# Patient Record
Sex: Female | Born: 1960 | Race: White | Hispanic: No | Marital: Married | Smoking: Never smoker
Health system: Southern US, Community
[De-identification: ages and names within clinical notes are randomized; demographics above are authoritative.]

## PROBLEM LIST (undated history)

## (undated) DIAGNOSIS — M797 Fibromyalgia: Secondary | ICD-10-CM

## (undated) DIAGNOSIS — M1990 Unspecified osteoarthritis, unspecified site: Secondary | ICD-10-CM

## (undated) DIAGNOSIS — F319 Bipolar disorder, unspecified: Secondary | ICD-10-CM

## (undated) DIAGNOSIS — F209 Schizophrenia, unspecified: Secondary | ICD-10-CM

## (undated) HISTORY — PX: KNEE SURGERY: SHX244

---

## 2014-03-01 ENCOUNTER — Encounter (HOSPITAL_BASED_OUTPATIENT_CLINIC_OR_DEPARTMENT_OTHER): Payer: Self-pay | Admitting: Emergency Medicine

## 2014-03-01 ENCOUNTER — Emergency Department (HOSPITAL_BASED_OUTPATIENT_CLINIC_OR_DEPARTMENT_OTHER): Payer: Federal, State, Local not specified - PPO

## 2014-03-01 ENCOUNTER — Emergency Department (HOSPITAL_BASED_OUTPATIENT_CLINIC_OR_DEPARTMENT_OTHER)
Admission: EM | Admit: 2014-03-01 | Discharge: 2014-03-02 | Disposition: A | Discharge status: Home / Self Care | Payer: Federal, State, Local not specified - PPO | Attending: Emergency Medicine | Admitting: Emergency Medicine

## 2014-03-01 DIAGNOSIS — F411 Generalized anxiety disorder: Secondary | ICD-10-CM | POA: Insufficient documentation

## 2014-03-01 DIAGNOSIS — R0600 Dyspnea, unspecified: Secondary | ICD-10-CM

## 2014-03-01 DIAGNOSIS — L259 Unspecified contact dermatitis, unspecified cause: Secondary | ICD-10-CM | POA: Insufficient documentation

## 2014-03-01 DIAGNOSIS — Z8659 Personal history of other mental and behavioral disorders: Secondary | ICD-10-CM | POA: Insufficient documentation

## 2014-03-01 DIAGNOSIS — L309 Dermatitis, unspecified: Secondary | ICD-10-CM

## 2014-03-01 DIAGNOSIS — F419 Anxiety disorder, unspecified: Secondary | ICD-10-CM

## 2014-03-01 DIAGNOSIS — Z8739 Personal history of other diseases of the musculoskeletal system and connective tissue: Secondary | ICD-10-CM | POA: Insufficient documentation

## 2014-03-01 HISTORY — DX: Bipolar disorder, unspecified: F31.9

## 2014-03-01 HISTORY — DX: Fibromyalgia: M79.7

## 2014-03-01 HISTORY — DX: Schizophrenia, unspecified: F20.9

## 2014-03-01 HISTORY — DX: Unspecified osteoarthritis, unspecified site: M19.90

## 2014-03-01 LAB — COMPREHENSIVE METABOLIC PANEL
ALT: 16 U/L (ref 0–35)
AST: 10 U/L (ref 0–37)
Albumin: 3.7 g/dL (ref 3.5–5.2)
Alkaline Phosphatase: 67 U/L (ref 39–117)
BUN: 12 mg/dL (ref 6–23)
CALCIUM: 11 mg/dL — AB (ref 8.4–10.5)
CO2: 24 meq/L (ref 19–32)
Chloride: 96 mEq/L (ref 96–112)
Creatinine, Ser: 0.8 mg/dL (ref 0.50–1.10)
GFR, EST NON AFRICAN AMERICAN: 83 mL/min — AB (ref >90)
GLUCOSE: 122 mg/dL — AB (ref 70–99)
Potassium: 3.6 mEq/L — ABNORMAL LOW (ref 3.7–5.3)
Sodium: 133 mEq/L — ABNORMAL LOW (ref 137–147)
Total Bilirubin: 0.2 mg/dL — ABNORMAL LOW (ref 0.3–1.2)
Total Protein: 7.2 g/dL (ref 6.0–8.3)

## 2014-03-01 LAB — CBC WITH DIFFERENTIAL/PLATELET
BASOS PCT: 0 % (ref 0–1)
Basophils Absolute: 0 10*3/uL (ref 0.0–0.1)
EOS ABS: 0.2 10*3/uL (ref 0.0–0.7)
EOS PCT: 1 % (ref 0–5)
HEMATOCRIT: 31 % — AB (ref 36.0–46.0)
HEMOGLOBIN: 9.9 g/dL — AB (ref 12.0–15.0)
Lymphocytes Relative: 10 % — ABNORMAL LOW (ref 12–46)
Lymphs Abs: 1.2 10*3/uL (ref 0.7–4.0)
MCH: 31.7 pg (ref 26.0–34.0)
MCHC: 31.9 g/dL (ref 30.0–36.0)
MCV: 99.4 fL (ref 78.0–100.0)
MONO ABS: 1.1 10*3/uL — AB (ref 0.1–1.0)
Monocytes Relative: 9 % (ref 3–12)
Neutro Abs: 9.2 10*3/uL — ABNORMAL HIGH (ref 1.7–7.7)
Neutrophils Relative %: 79 % — ABNORMAL HIGH (ref 43–77)
Platelets: 232 10*3/uL (ref 150–400)
RBC: 3.12 MIL/uL — ABNORMAL LOW (ref 3.87–5.11)
RDW: 14 % (ref 11.5–15.5)
WBC: 11.5 10*3/uL — ABNORMAL HIGH (ref 4.0–10.5)

## 2014-03-01 LAB — TROPONIN I: Troponin I: <0.3 ng/mL (ref <0.30)

## 2014-03-01 LAB — PRO B NATRIURETIC PEPTIDE: Pro B Natriuretic peptide (BNP): 78.9 pg/mL (ref 0–125)

## 2014-03-01 NOTE — ED Provider Notes (Signed)
CSN: [id number]     Arrival date & time 03/01/14  2217 History  This chart was scribed for Kathy Kaplan, MD by Blanchard Kelch, ED Scribe. The patient was seen in room MH11/MH11. Patient's care was started at 11:05 PM.     Chief Complaint  Patient presents with  . Shortness of Breath    Patient is a 53 y.o. female presenting with shortness of breath. The history is provided by the patient. No language interpreter was used.  Shortness of Breath   HPI Comments: Kathy Cooley is a 53 y.o. female with a history of fibromyalgia, bipolar disorder, schizophrenia, hypertension, thyroid disease brought in by ambulance who presents to the Emergency Department complaining of an episode of shortness of breath that occurred while she was driving to work two and a half hours ago. The episode lasted about an hour. She states that she had associated shaking, generalized weakness and a "cloudy head" with the shortness of breath. She is unsure what alleviated the symptoms but states that her head feels clear again. She states that she is "in remission" for her mental issues. She denies hallucinations, SI or HI currently. She also reports a burning, erythematous, draining rash to her bilateral lower legs. She states that it has been present for months but she has been seen by a doctor for it. She has been putting a prescribed lotion on the rash without relief. She does not believe she is on antibiotics for it but does not have her medications with her. She takes Lithium and Geodine for her schizophrenia. She reports taking all her mediations compliantly. She denies a history of MI or diabetes.    [phone] is the number of her husband, who may be on the way to the ED.     Past Medical History  Diagnosis Date  . Fibromyalgia   . Arthritis   . Bipolar 1 disorder   . Schizophrenia    Past Surgical History  Procedure Laterality Date  . Knee surgery     History reviewed. No pertinent family  history. History  Substance Use Topics  . Smoking status: Never Smoker   . Smokeless tobacco: Not on file  . Alcohol Use: No   OB History   Grav Para Term Preterm Abortions TAB SAB Ect Mult Living                 Review of Systems  Respiratory: Positive for shortness of breath.   Neurological: Positive for tremors and weakness.  Psychiatric/Behavioral: Negative for suicidal ideas, hallucinations, behavioral problems, confusion, self-injury and decreased concentration. The patient is not nervous/anxious.       Allergies  Review of patient's allergies indicates no known allergies.  Home Medications  No current outpatient prescriptions on file. Triage Vitals: BP 157/53  Pulse 71  Temp(Src) 97.8 F (36.6 C) (Oral)  Resp 22  Ht 5\' 3"  (1.6 m)  Wt 277 lb (125.646 kg)  BMI 49.08 kg/m2  SpO2 97%  Physical Exam  Nursing note and vitals reviewed. Constitutional: She is oriented to person, place, and time. She appears well-developed and well-nourished. No distress.  HENT:  Head: Normocephalic and atraumatic.  Eyes: EOM are normal.  Neck: Normal range of motion.  Mild distention of neck veins.   Cardiovascular: Normal rate and regular rhythm.   No murmur heard. Pulmonary/Chest: Effort normal and breath sounds normal. She has no wheezes. She has no rhonchi. She has no rales.  Neurological: She is alert and oriented to person, place,  and time.  Skin: Skin is warm and dry.  Bilateral leg edema with some hyperpigmentation of the skin. Both of the legs are warm to touch. No crepitus. Both of the legs have 6x6 cm of granular patches of tissue. No active drainage. Mild tenderness with palpation.  Psychiatric: She has a normal mood and affect. Judgment and thought content normal.    ED Course  Procedures (including critical care time)  DIAGNOSTIC STUDIES: Oxygen Saturation is 97% on room air, adequate by my interpretation.    COORDINATION OF CARE: 11:34 PM -Will order chest  x-ray, Lithium level, CBC, BNP, Troponin I, CMP and EKG. Patient verbalizes understanding and agrees with treatment plan.    Labs Review Labs Reviewed  CBC WITH DIFFERENTIAL - Abnormal; Notable for the following:    WBC 11.5 (*)    RBC 3.12 (*)    Hemoglobin 9.9 (*)    HCT 31.0 (*)    Neutrophils Relative % 79 (*)    Neutro Abs 9.2 (*)    Lymphocytes Relative 10 (*)    Monocytes Absolute 1.1 (*)    All other components within normal limits  COMPREHENSIVE METABOLIC PANEL - Abnormal; Notable for the following:    Sodium 133 (*)    Potassium 3.6 (*)    Glucose, Bld 122 (*)    Calcium 11.0 (*)    Total Bilirubin 0.2 (*)    GFR calc non Af Amer 83 (*)    All other components within normal limits  PRO B NATRIURETIC PEPTIDE  TROPONIN I  LITHIUM LEVEL   Imaging Review Dg Chest 2 View  03/01/2014   CLINICAL DATA:  Shortness of breath.  EXAM: CHEST  2 VIEW  COMPARISON:  None.  FINDINGS: Mild cardiomegaly. No edema or consolidation. Increased markings at the bases. No effusion or pneumothorax.  IMPRESSION: Mild atelectasis or bronchitic change at the bases.   Electronically Signed   By: Tiburcio PeaJonathan  Watts M.D.   On: 03/01/2014 23:35     EKG Interpretation None      MDM   Final diagnoses:  None   I personally performed the services described in this documentation, which was scribed in my presence. The recorded information has been reviewed and is accurate.  Pt comes in with cc of dib. She has no significant medical hx, but c/o psych related issues. Sudden onset dib, with "cloudy feeling" of her head. [place] in [place], and was drinving when the sx started. She started shaking as well, when it appears that her co-employee called EMS. She had no chest pain, and is symptom free right now, resting comfortably.  Unsure what caused her sudden onset dyspnea, shaking and "head clouding".   Her leg exam shows skin hyperpigmentation and some raised patches, yellow in color  diffusely. States that she is being treated by PCP for dermatitis / cellulitis - and that her legs are unchanged. Labs are normal, mild leukocytosis, lithium is normal.   On the psych front, she has a little abnormal affect and may be a little forced speech - but she is certainly not demonstrating grandiose, poor judgement, and doesn't appear to be unstable manic. She has no SI/HI/Psychoses/delusions - and feels normal in that regard. She rahter see her psychiatrist.  Will await husband to pick her up.   7:24 AM Husband on his way. Pt's psych asessment is still the same. Litle forced speech - otherwise no instability. [facility] Fre[facility]Allen [facility] to [facility] up.  Kathy Kaplan, MD 03/02/14 [phone]

## 2014-03-01 NOTE — ED Notes (Signed)
Pt was at work, states that SOB started while driving to work.  Got worse once she got to work.  BS clear, no respiratory distress.

## 2014-03-01 NOTE — ED Notes (Signed)
Pt placed on heart monitor.

## 2014-03-02 LAB — LITHIUM LEVEL: Lithium Lvl: 1.18 mEq/L (ref 0.80–1.40)

## 2014-03-02 MED ORDER — ONDANSETRON HCL 4 MG/2ML IJ SOLN
4.0000 mg | Freq: Once | INTRAMUSCULAR | Status: AC
  Administered 2014-03-02: 4 mg via INTRAVENOUS

## 2014-03-02 MED ORDER — ONDANSETRON HCL 4 MG/2ML IJ SOLN
INTRAMUSCULAR | Status: AC
  Filled 2014-03-02: qty 2

## 2014-03-02 NOTE — Discharge Instructions (Signed)
We saw you in the ER for the difficulty in breathing, and your head complains. All the results in the ER are normal, labs and imaging. We are not sure what is causing your symptoms. The workup in the ER is not complete, and is limited to screening for life threatening and emergent conditions only, so please see a primary care doctor for further evaluation. Also - it is important to make sure you see your primary doctor for proper management of your legs.  Generalized Anxiety Disorder Generalized anxiety disorder (GAD) is a mental disorder. It interferes with life functions, including relationships, work, and school. GAD is different from normal anxiety, which everyone experiences at some point in their lives in response to specific life events and activities. Normal anxiety actually helps us prepare for and get through these life events and activities. Normal anxiety goes away after the event or activity is over.  GAD causes anxiety that is not necessarily related to specific events or activities. It also causes excess anxiety in proportion to specific events or activities. The anxiety associated with GAD is also difficult to control. GAD can vary from mild to severe. People with severe GAD can have intense waves of anxiety with physical symptoms (panic attacks).  SYMPTOMS The anxiety and worry associated with GAD are difficult to control. This anxiety and worry are related to many life events and activities and also occur more days than not for 6 months or longer. People with GAD also have three or more of the following symptoms (one or more in children):  Restlessness.   Fatigue.  Difficulty concentrating.   Irritability.  Muscle tension.  Difficulty sleeping or unsatisfying sleep. DIAGNOSIS GAD is diagnosed through an assessment by your caregiver. Your caregiver will ask you questions aboutyour mood,physical symptoms, and events in your life. Your caregiver may ask you about your  medical history and use of alcohol or drugs, including prescription medications. Your caregiver may also do a physical exam and blood tests. Certain medical conditions and the use of certain substances can cause symptoms similar to those associated with GAD. Your caregiver may refer you to a mental health specialist for further evaluation. TREATMENT The following therapies are usually used to treat GAD:   Medication Antidepressant medication usually is prescribed for long-term daily control. Antianxiety medications may be added in severe cases, especially when panic attacks occur.   Talk therapy (psychotherapy) Certain types of talk therapy can be helpful in treating GAD by providing support, education, and guidance. A form of talk therapy called cognitive behavioral therapy can teach you healthy ways to think about and react to daily life events and activities.  Stress managementtechniques These include yoga, meditation, and exercise and can be very helpful when they are practiced regularly. A mental health specialist can help determine which treatment is best for you. Some people see improvement with one therapy. However, other people require a combination of therapies. Document Released: 03/19/2013 Document Reviewed: 03/19/2013 [facility] Patient Information 2014 [place], [place].

## 2014-03-02 NOTE — ED Notes (Signed)
Pt reports that her husband gets up at 5am, states that at that time, he should be travelling down to pick her up.  States that husband had to sleep.  [place] and will take approx 2 hours to get here.

## 2014-03-02 NOTE — ED Notes (Signed)
Husband is here to pick up pt.  He states she is acting appropriately for her but has been declining in health over the past several weeks.  Encouraged pt and husband to follow up with her physician and psychiatrist.

## 2014-10-17 IMAGING — CR DG CHEST 2V
2 series · 2 of 2 positions shown · non-contrast
Comparison: None.

CLINICAL DATA: Shortness of breath.

EXAM:
CHEST  2 VIEW

[w chest pa]
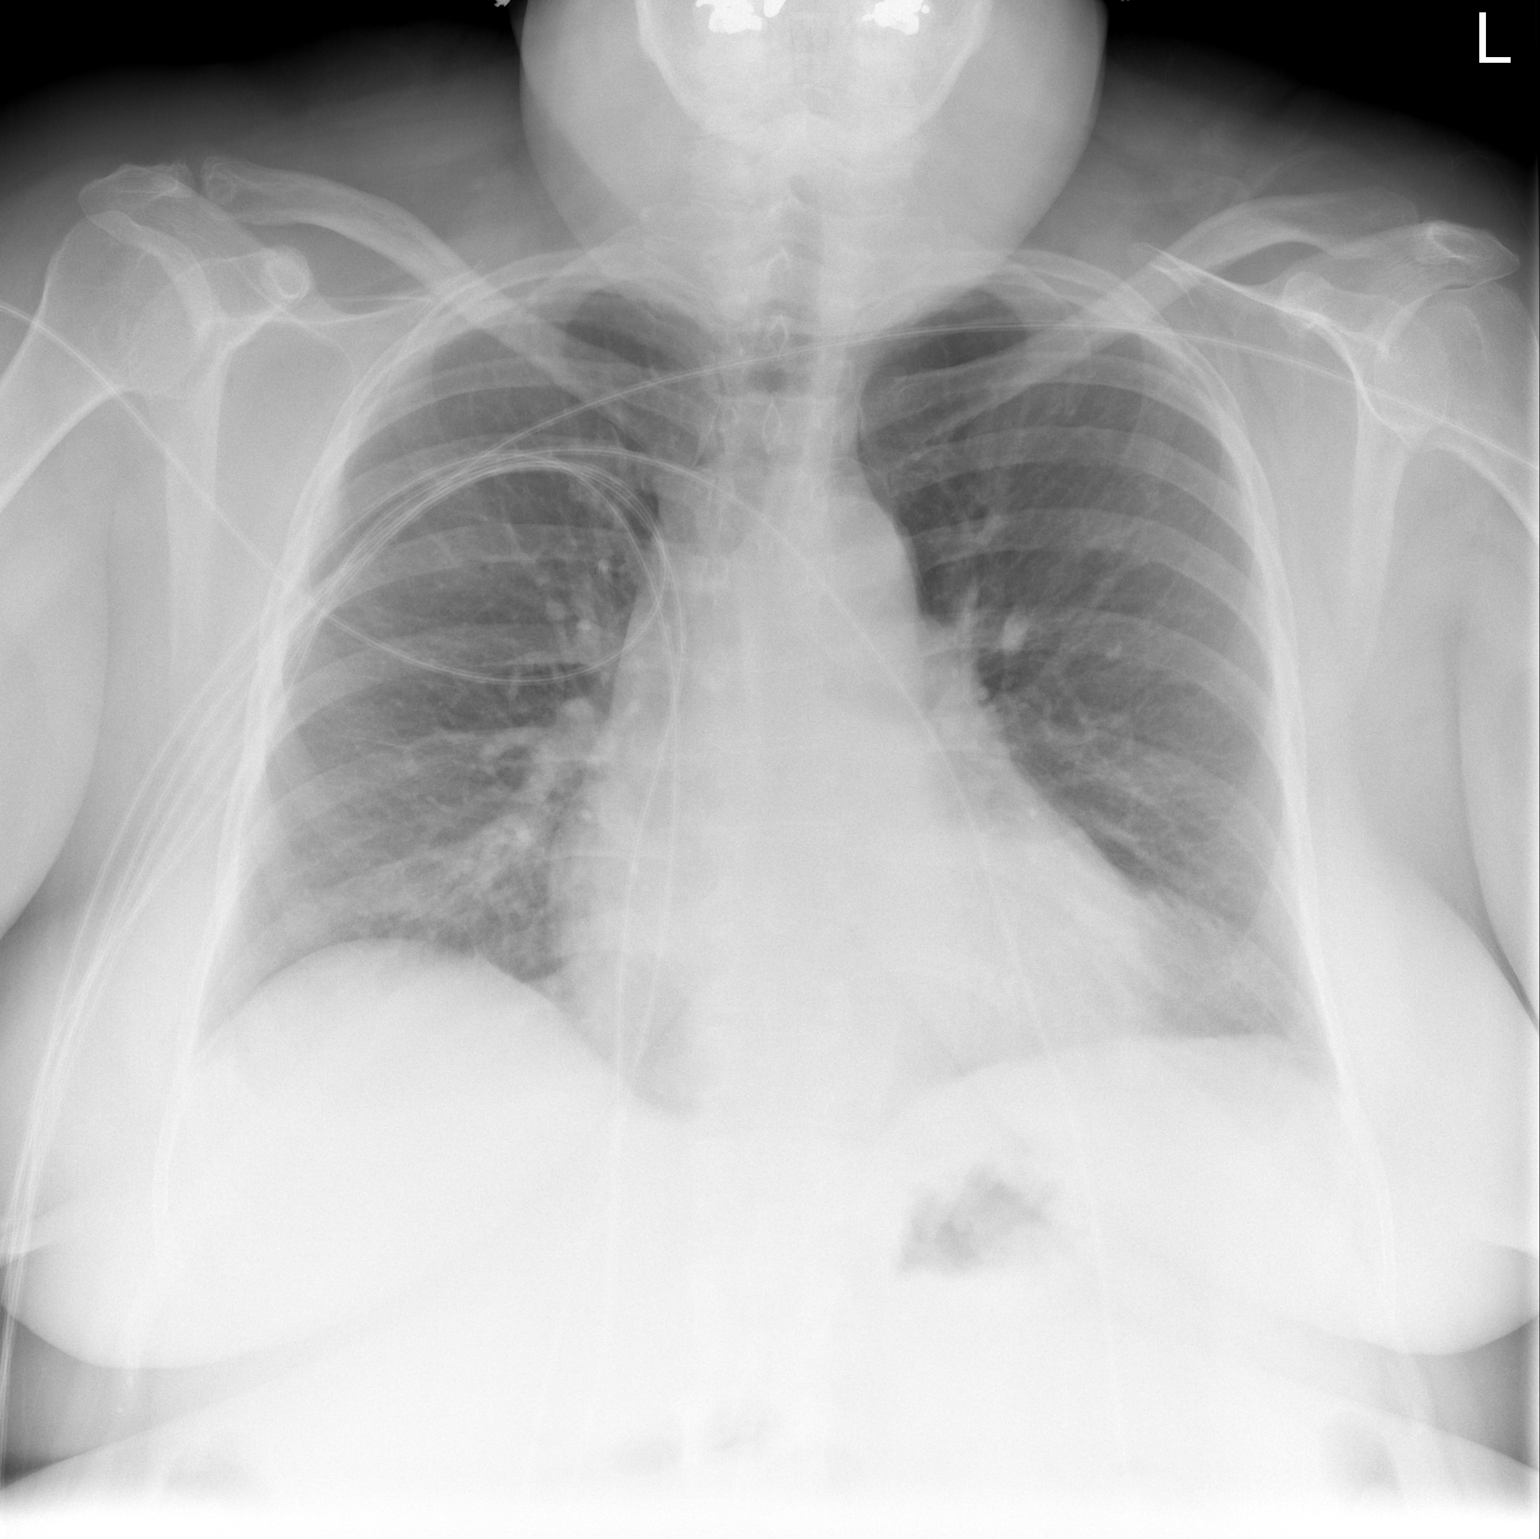

[w chest lat]
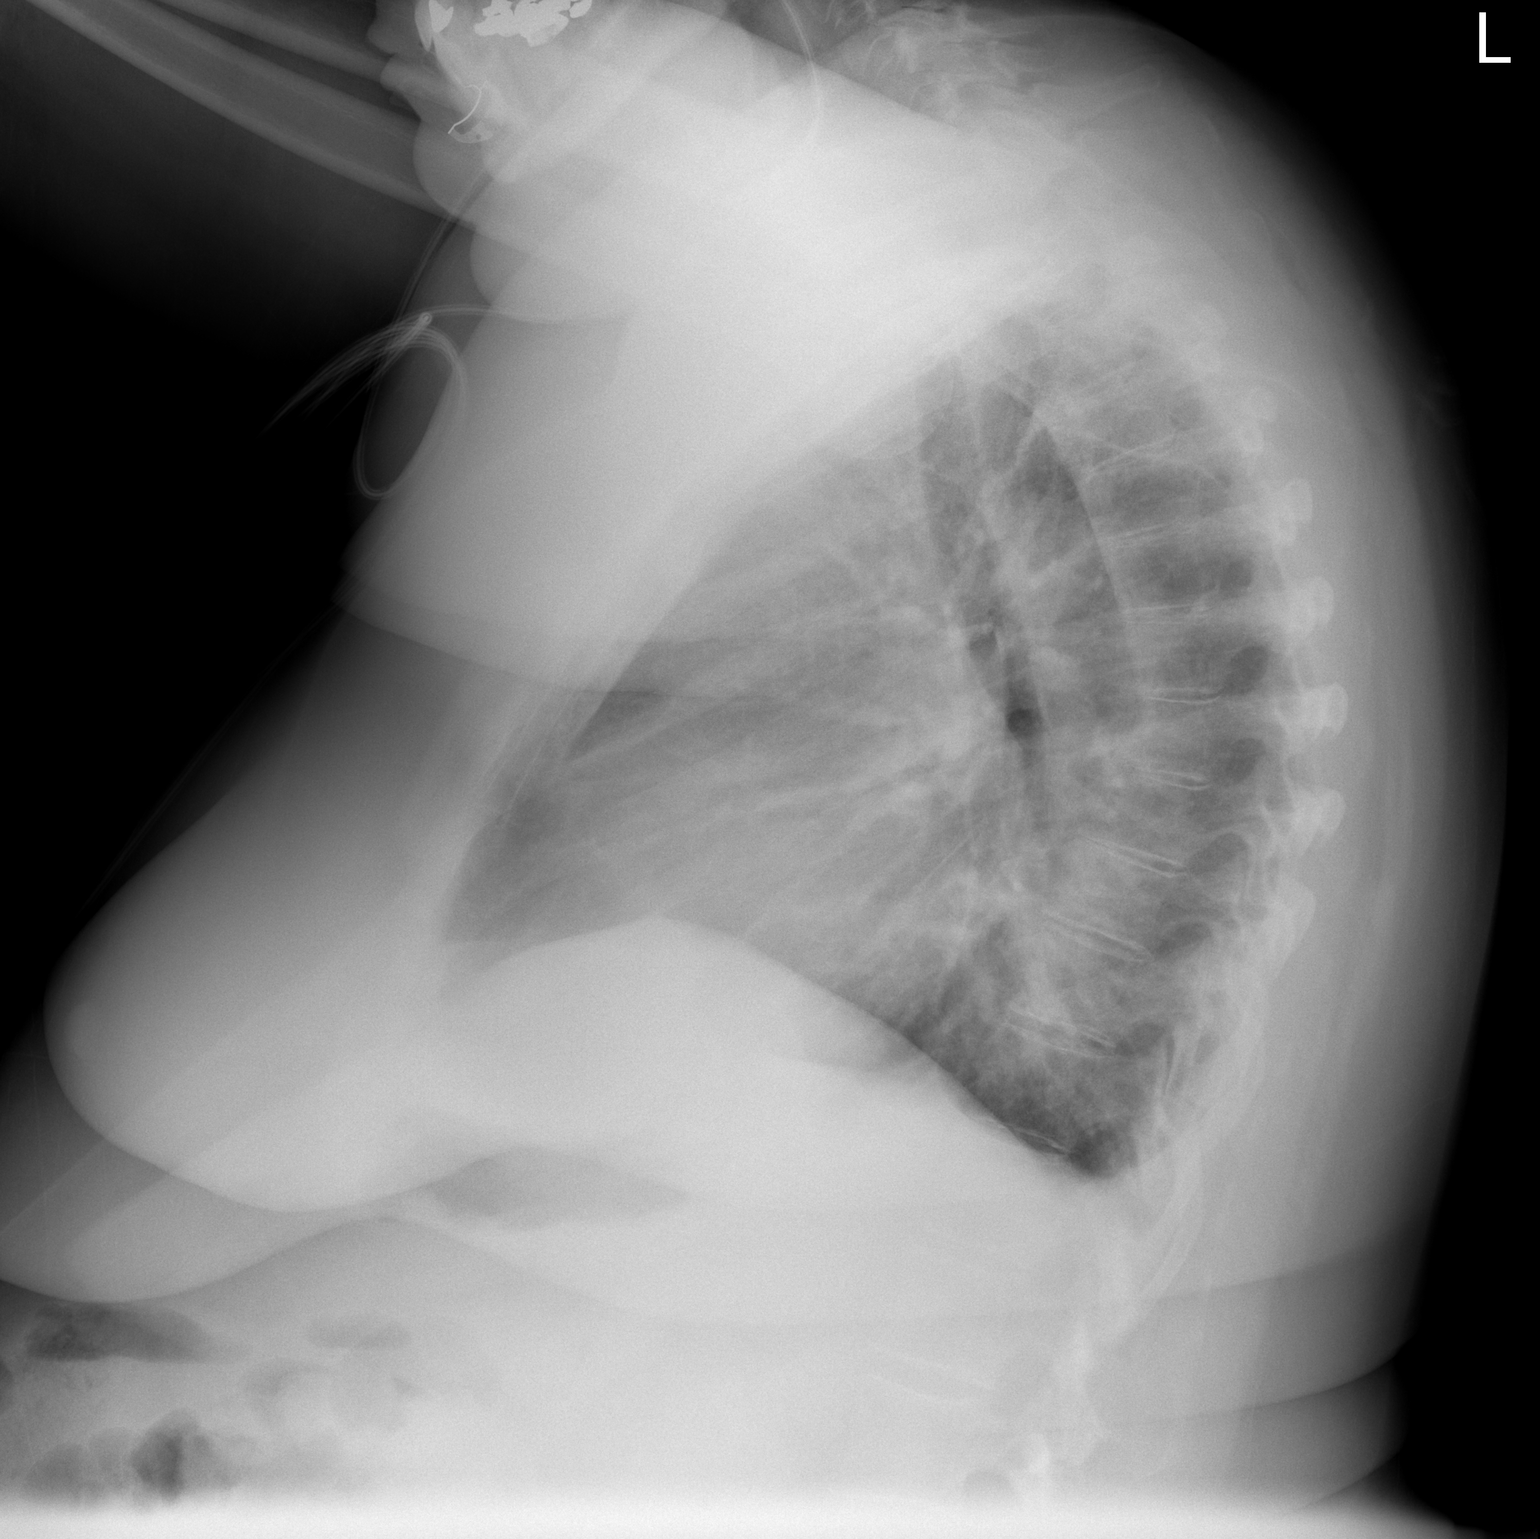

[2 of 2 positions shown; findings below may reference images not displayed]

FINDINGS: Mild cardiomegaly. No edema or consolidation. Increased markings at
the bases. No effusion or pneumothorax.
IMPRESSION: Mild atelectasis or bronchitic change at the bases.

## 2016-04-30 ENCOUNTER — Emergency Department (HOSPITAL_COMMUNITY)
Admission: EM | Admit: 2016-04-30 | Discharge: 2016-04-30 | Disposition: A | Discharge status: Home / Self Care | Attending: Emergency Medicine | Admitting: Emergency Medicine

## 2016-04-30 ENCOUNTER — Encounter (HOSPITAL_COMMUNITY): Payer: Self-pay | Admitting: *Deleted

## 2016-04-30 DIAGNOSIS — Y939 Activity, unspecified: Secondary | ICD-10-CM | POA: Insufficient documentation

## 2016-04-30 DIAGNOSIS — F319 Bipolar disorder, unspecified: Secondary | ICD-10-CM | POA: Insufficient documentation

## 2016-04-30 DIAGNOSIS — W228XXA Striking against or struck by other objects, initial encounter: Secondary | ICD-10-CM | POA: Diagnosis not present

## 2016-04-30 DIAGNOSIS — Y999 Unspecified external cause status: Secondary | ICD-10-CM | POA: Diagnosis not present

## 2016-04-30 DIAGNOSIS — Y929 Unspecified place or not applicable: Secondary | ICD-10-CM | POA: Diagnosis not present

## 2016-04-30 DIAGNOSIS — Z23 Encounter for immunization: Secondary | ICD-10-CM | POA: Diagnosis not present

## 2016-04-30 DIAGNOSIS — S81811A Laceration without foreign body, right lower leg, initial encounter: Secondary | ICD-10-CM

## 2016-04-30 MED ORDER — LIDOCAINE HCL 2 % IJ SOLN
INTRAMUSCULAR | Status: AC
  Filled 2016-04-30: qty 20

## 2016-04-30 MED ORDER — LIDOCAINE-EPINEPHRINE (PF) 2 %-1:200000 IJ SOLN: 10.0000 mL | Freq: Once | INTRAMUSCULAR | Status: DC

## 2016-04-30 MED ORDER — TETANUS-DIPHTH-ACELL PERTUSSIS 5-2.5-18.5 LF-MCG/0.5 IM SUSP
0.5000 mL | Freq: Once | INTRAMUSCULAR | Status: AC
  Administered 2016-04-30: 0.5 mL via INTRAMUSCULAR
  Filled 2016-04-30: qty 0.5

## 2016-04-30 NOTE — ED Provider Notes (Signed)
CSN: [id number]     Arrival date & time 04/30/16  0444 History   First MD Initiated Contact with Patient 04/30/16 [phone]     Chief Complaint  Patient presents with  . Extremity Laceration   (Consider location/radiation/quality/duration/timing/severity/associated sxs/prior Treatment) HPI 55 y.o. female presents to the Emergency Department today complaining of right posterior lower extremity laceration since 3AM. Notes backing into metal cart and suffering 4 inch laceration. Bleeding controlled. Pain is 7/10. Has not tried any OTC remedies. No fevers. Unsure of last Tetanus. No other symptoms noted.   Past Medical History  Diagnosis Date  . Fibromyalgia   . Arthritis   . Bipolar 1 disorder (HCC)   . Schizophrenia [facility](HCC)    Past Surgical History  Procedure Laterality Date  . Knee surgery     No family history on file. Social History  Substance Use Topics  . Smoking status: Never Smoker   . Smokeless tobacco: None  . Alcohol Use: No   OB History    No data available     Review of Systems  Constitutional: Negative for fever.  Musculoskeletal: Negative for myalgias and gait problem.  Skin: Positive for wound.   Allergies  Review of patient's allergies indicates no known allergies.  Home Medications   Prior to Admission medications   Not on File   BP 164/68 mmHg  Pulse 82  Temp(Src) 98.1 F (36.7 C) (Oral)  Resp 20  SpO2 94%   Physical Exam  Constitutional: She is oriented to person, place, and time. She appears well-developed and well-nourished.  HENT:  Head: Normocephalic and atraumatic.  Eyes: EOM are normal.  Cardiovascular: Normal rate and regular rhythm.   Pulmonary/Chest: Effort normal.  Abdominal: Soft.  Musculoskeletal: Normal range of motion.  4 inch right posterior lower extremity laceration ~4 inches. Jagged. Bleeding controlled at this time. Neurovascularly intact.   Neurological: She is alert and oriented to person, place, and time.  Skin: Skin is  warm and dry.  Psychiatric: She has a normal mood and affect. Her behavior is normal. Thought content normal.  Nursing note and vitals reviewed.   ED Course  .Marland Kitchen.Laceration Repair Date/Time: 04/30/2016 8:02 AM Performed by: Audry PiliMOHR, Elaysha Bevard Authorized by: Audry PiliMOHR, Tyion Boylen Consent: Verbal consent obtained. Risks and benefits: risks, benefits and alternatives were discussed Consent given by: patient Patient understanding: patient states understanding of the procedure being performed Patient consent: the patient's understanding of the procedure matches consent given Patient identity confirmed: verbally with patient and arm band Body area: lower extremity Location details: right lower leg Laceration length: 8 cm Foreign bodies: no foreign bodies Tendon involvement: none Nerve involvement: none Vascular damage: no Anesthesia: local infiltration Local anesthetic: lidocaine 1% with epinephrine Anesthetic total: 5 ml Irrigation solution: saline Irrigation method: syringe Amount of cleaning: standard Debridement: minimal Degree of undermining: minimal Skin closure: 3-0 Prolene Number of sutures: 10 Technique: running Approximation: close Approximation difficulty: simple Dressing: 4x4 sterile gauze, antibiotic ointment and gauze roll Patient tolerance: Patient tolerated the procedure well with no immediate complications   (including critical care time) Labs Review Labs Reviewed - No data to display  Imaging Review No results found. I have personally reviewed and evaluated these images and lab results as part of my medical decision-making.   EKG Interpretation None     MDM  I have reviewed the relevant previous healthcare records. I obtained HPI from historian. Patient discussed with supervising physician  ED Course:  Assessment: Patient is a 54yF that presents with laceration to  posterior left lower extremity. Tdap booster given. Pressure irrigation performed. Bottom of the wound  visualized with bleeding controled. Laceration occurred < 8 hours prior to repair which was well tolerated. Pt has no co morbidities to effect normal wound healing. Discussed suture home care w pt and answered questions. Pt to f-u for wound check and suture removal in 10 days. Pt is hemodynamically stable w no complaints prior to dc.    Disposition/Plan:  DC Home Additional Verbal discharge instructions given and discussed with patient.  Pt Instructed to f/u with PCP/ED in the next 8-10 days for evaluation and treatment of symptoms. Return precautions given Pt acknowledges and agrees with plan  Supervising Physician Azalia Bilis, MD   Final diagnoses:  Laceration of lower extremity, right, initial encounter     Audry Pili, PA-C 04/30/16 7425  Azalia Bilis, MD 04/30/16 [phone]

## 2016-04-30 NOTE — ED Notes (Signed)
Pt reports hitting her leg at some sort of metal cart, has bleeding under control with pressure bandage, sts wound is about 4 inches long, jagged.

## 2016-04-30 NOTE — Discharge Instructions (Signed)
Please read and follow all provided instructions.  Your diagnoses today include:  1. Laceration of lower extremity, right, initial encounter    Tests performed today include:  Vital signs. See below for your results today.   Medications prescribed:   Take any prescribed medications only as directed.   Home care instructions:  Follow any educational materials and wound care instructions contained in this packet.   You may shower and wash the area with soap and water, just be sure to pat the area dry and not rub over the stitches. Do no put your stiches underwater (in a bath, pool, or lake). Getting stiches wet can slow down healing and increase your chances of getting an infection. You may apply Bacitracin or Neosporin twice a day for 7 days, and keep the ara clean with  bandage or gauze. Do not apply alcohol or hydrogen peroxide. Cover the area if it draining or weeping.   Follow-up instructions: Suture Removal: Return to the Emergency Department or see your primary care care doctor in 10 days for a recheck of your wound and removal of your sutures or staples.    Return instructions:  Return to the Emergency Department if you have:  Fever  Worsening pain  Worsening swelling of the wound  Pus draining from the wound  Redness of the skin that moves away from the wound, especially if it streaks away from the affected area   Any other emergent concerns  Your vital signs today were: BP 164/68 mmHg   Pulse 82   Temp(Src) 98.1 F (36.7 C) (Oral)   Resp 20   SpO2 94% If your blood pressure (BP) was elevated above 135/85 this visit, please have this repeated by your doctor within one month. --------------

## 2016-04-30 NOTE — ED Notes (Signed)
PA at bedside.

## 2017-02-03 DEATH — deceased

## 2021-08-06 ENCOUNTER — Other Ambulatory Visit: Payer: Self-pay

## 2023-05-05 ENCOUNTER — Other Ambulatory Visit: Payer: Self-pay

## 2024-06-27 ENCOUNTER — Other Ambulatory Visit (HOSPITAL_COMMUNITY): Payer: Self-pay
